# Patient Record
Sex: Male | Born: 2005 | Race: Black or African American | Hispanic: No | Marital: Single | Smoking: Never smoker
Health system: Southern US, Community
[De-identification: ages and names within clinical notes are randomized; demographics above are authoritative.]

---
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
## 2023-10-05 IMAGING — CR DG FINGER LITTLE 2+V*L*
3 series · 3 of 3 positions shown · non-contrast
Comparison: None.

CLINICAL DATA: Left pinky injury on [REDACTED] at gym.

EXAM:
LEFT LITTLE FINGER 2+V

[finger ap]
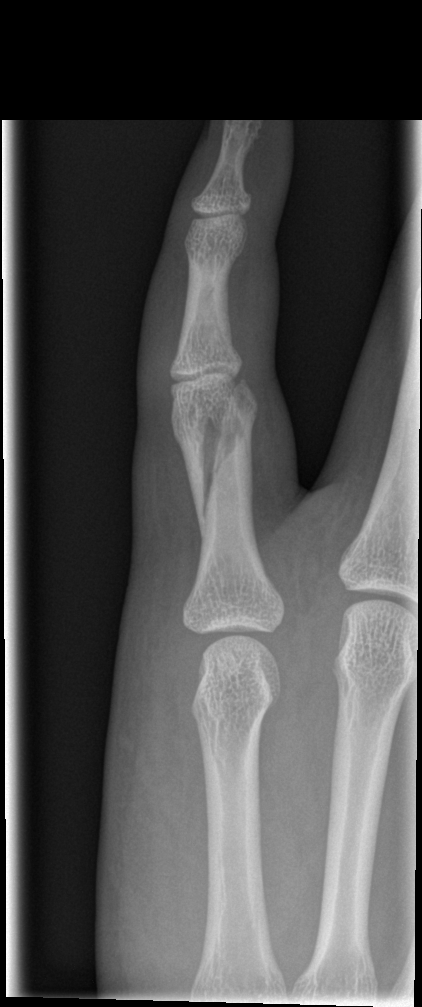

[finger obl]
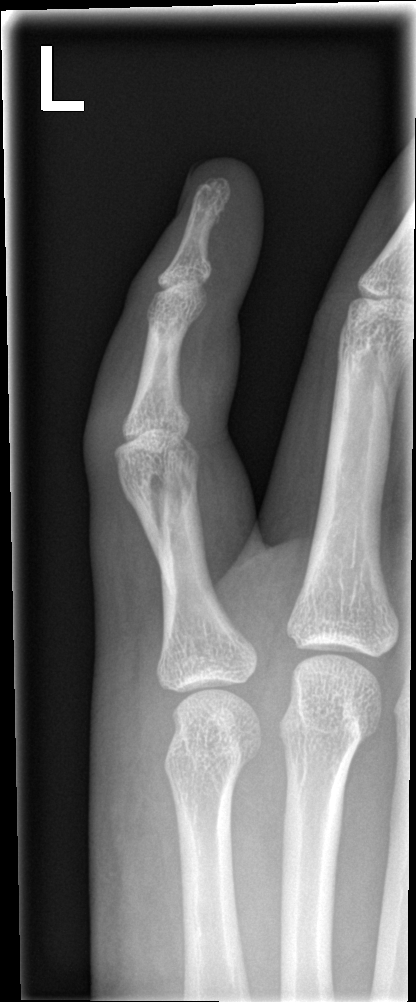

[finger lat]
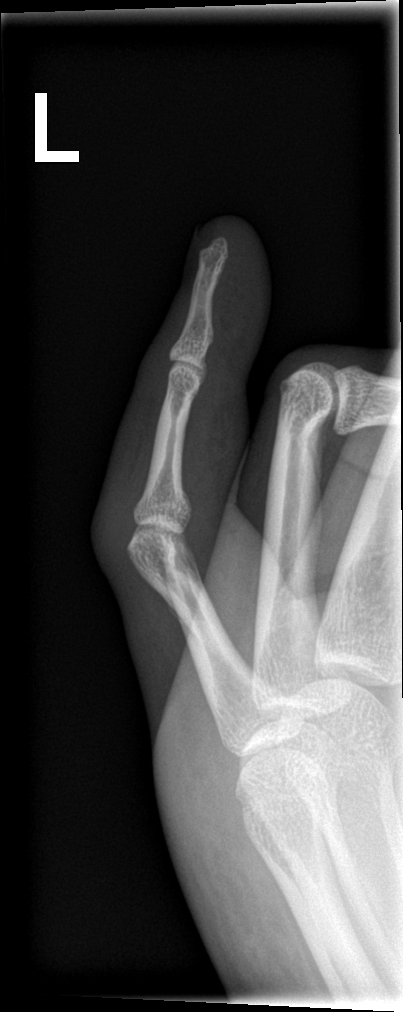

[3 of 3 positions shown; findings below may reference images not displayed]

FINDINGS: There is an acute, obliquely oriented fracture deformity extending
from the mid to distal shaft of the fifth proximal phalanx. There is
mild dorsal and medial angulation of the distal fracture fragments.
No dislocation.
IMPRESSION: Acute fracture involves the shaft of the fifth proximal phalanx.
